# Patient Record
Sex: Female | Born: 1970 | Race: Black or African American | Hispanic: No | Marital: Single | Smoking: Never smoker
Health system: Southern US, Community
[De-identification: ages and names within clinical notes are randomized; demographics above are authoritative.]

## PROBLEM LIST (undated history)

## (undated) DIAGNOSIS — R030 Elevated blood-pressure reading, without diagnosis of hypertension: Secondary | ICD-10-CM

## (undated) HISTORY — DX: Elevated blood-pressure reading, without diagnosis of hypertension: R03.0

## (undated) HISTORY — PX: MOUTH SURGERY: SHX715

## (undated) HISTORY — PX: TUBAL LIGATION: SHX77

---

## 2005-09-28 ENCOUNTER — Ambulatory Visit: Payer: Self-pay | Admitting: Gynecology

## 2005-09-28 ENCOUNTER — Inpatient Hospital Stay (HOSPITAL_COMMUNITY): Admission: AD | Admit: 2005-09-28 | Discharge: 2005-09-28 | Payer: Self-pay | Admitting: Gynecology

## 2005-10-01 ENCOUNTER — Inpatient Hospital Stay (HOSPITAL_COMMUNITY): Admission: AD | Admit: 2005-10-01 | Discharge: 2005-10-03 | Payer: Self-pay | Admitting: Obstetrics and Gynecology

## 2005-10-01 ENCOUNTER — Ambulatory Visit: Payer: Self-pay | Admitting: Obstetrics and Gynecology

## 2006-10-14 ENCOUNTER — Emergency Department (HOSPITAL_COMMUNITY): Admission: EM | Admit: 2006-10-14 | Discharge: 2006-10-14 | Payer: Self-pay | Admitting: Family Medicine

## 2007-08-17 ENCOUNTER — Ambulatory Visit: Payer: Self-pay | Admitting: Gynecology

## 2007-10-26 ENCOUNTER — Emergency Department (HOSPITAL_COMMUNITY): Admission: EM | Admit: 2007-10-26 | Discharge: 2007-10-26 | Payer: Self-pay | Admitting: Emergency Medicine

## 2007-11-05 ENCOUNTER — Emergency Department (HOSPITAL_COMMUNITY): Admission: EM | Admit: 2007-11-05 | Discharge: 2007-11-05 | Payer: Self-pay | Admitting: Emergency Medicine

## 2007-11-07 ENCOUNTER — Ambulatory Visit: Payer: Self-pay | Admitting: Gynecology

## 2007-11-27 ENCOUNTER — Ambulatory Visit: Payer: Self-pay | Admitting: Internal Medicine

## 2007-11-27 ENCOUNTER — Encounter (INDEPENDENT_AMBULATORY_CARE_PROVIDER_SITE_OTHER): Payer: Self-pay | Admitting: Family Medicine

## 2007-11-27 LAB — CONVERTED CEMR LAB
ALT: 23 units/L (ref 0–35)
BUN: 12 mg/dL (ref 6–23)
CO2: 23 meq/L (ref 19–32)
CRP: 3.5 mg/dL — ABNORMAL HIGH (ref <0.6)
Cholesterol: 136 mg/dL (ref 0–200)
Creatinine, Ser: 0.76 mg/dL (ref 0.40–1.20)
Eosinophils Absolute: 0.1 10*3/uL (ref 0.0–0.7)
Eosinophils Relative: 1 % (ref 0–5)
HCT: 39.9 % (ref 36.0–46.0)
HDL: 37 mg/dL — ABNORMAL LOW (ref >39)
Lymphs Abs: 2.1 10*3/uL (ref 0.7–4.0)
MCV: 93.2 fL (ref 78.0–100.0)
Platelets: 548 10*3/uL — ABNORMAL HIGH (ref 150–400)
RDW: 12.8 % (ref 11.5–15.5)
Sed Rate: 81 mm/hr — ABNORMAL HIGH (ref 0–22)
Total Bilirubin: 0.6 mg/dL (ref 0.3–1.2)
Total CHOL/HDL Ratio: 3.7
Triglycerides: 71 mg/dL (ref <150)
VLDL: 14 mg/dL (ref 0–40)
WBC: 5.3 10*3/uL (ref 4.0–10.5)

## 2007-11-29 ENCOUNTER — Ambulatory Visit (HOSPITAL_COMMUNITY): Admission: RE | Admit: 2007-11-29 | Discharge: 2007-11-29 | Payer: Self-pay | Admitting: Gynecology

## 2007-11-29 ENCOUNTER — Ambulatory Visit: Payer: Self-pay | Admitting: Gynecology

## 2007-12-18 ENCOUNTER — Ambulatory Visit: Payer: Self-pay | Admitting: Internal Medicine

## 2009-03-31 ENCOUNTER — Ambulatory Visit: Payer: Self-pay | Admitting: Family Medicine

## 2009-08-14 ENCOUNTER — Ambulatory Visit: Payer: Self-pay | Admitting: Internal Medicine

## 2009-08-20 ENCOUNTER — Ambulatory Visit: Payer: Self-pay | Admitting: Family Medicine

## 2009-09-14 ENCOUNTER — Encounter: Admission: RE | Admit: 2009-09-14 | Discharge: 2009-09-14 | Payer: Self-pay | Admitting: Internal Medicine

## 2010-04-09 ENCOUNTER — Encounter: Admission: RE | Admit: 2010-04-09 | Discharge: 2010-04-09 | Payer: Self-pay | Admitting: Internal Medicine

## 2010-07-02 ENCOUNTER — Ambulatory Visit: Payer: Self-pay | Admitting: Obstetrics & Gynecology

## 2010-08-06 ENCOUNTER — Ambulatory Visit
Admission: RE | Admit: 2010-08-06 | Discharge: 2010-08-06 | Payer: Self-pay | Source: Home / Self Care | Attending: Obstetrics and Gynecology | Admitting: Obstetrics and Gynecology

## 2010-08-15 ENCOUNTER — Encounter: Payer: Self-pay | Admitting: Internal Medicine

## 2010-08-27 ENCOUNTER — Other Ambulatory Visit: Payer: Self-pay | Admitting: Family Medicine

## 2010-08-27 DIAGNOSIS — Z09 Encounter for follow-up examination after completed treatment for conditions other than malignant neoplasm: Secondary | ICD-10-CM

## 2010-09-23 ENCOUNTER — Ambulatory Visit
Admission: RE | Admit: 2010-09-23 | Discharge: 2010-09-23 | Disposition: A | Discharge status: Home / Self Care | Payer: Self-pay | Source: Ambulatory Visit | Attending: Family Medicine | Admitting: Family Medicine

## 2010-09-23 DIAGNOSIS — Z09 Encounter for follow-up examination after completed treatment for conditions other than malignant neoplasm: Secondary | ICD-10-CM

## 2010-09-29 ENCOUNTER — Ambulatory Visit
Admission: RE | Admit: 2010-09-29 | Discharge: 2010-09-29 | Disposition: A | Discharge status: Home / Self Care | Payer: Self-pay | Source: Ambulatory Visit | Attending: Family Medicine | Admitting: Family Medicine

## 2010-09-29 ENCOUNTER — Other Ambulatory Visit: Payer: Self-pay | Admitting: Family Medicine

## 2010-09-29 DIAGNOSIS — Z09 Encounter for follow-up examination after completed treatment for conditions other than malignant neoplasm: Secondary | ICD-10-CM

## 2010-12-07 NOTE — H&P (Signed)
Mckenzie Perez, Mckenzie Perez                ACCOUNT NO.:  [id number]   MEDICAL RECORD NO.:  [id number]          PATIENT TYPE:  AMB   LOCATION:                                FACILITY:  [facility]   PHYSICIAN:  Ginger Carne, MD  DATE OF BIRTH:  [birth year]   DATE OF ADMISSION:  11/29/2007  DATE OF DISCHARGE:                              HISTORY & PHYSICAL   REASON FOR HOSPITALIZATION:  Sterilization.   HISTORY OF PRESENT ILLNESS:  This patient is a 40 year old gravida 5,  para 3-0-2-3 female who is desirous of sterilization.  She presently  uses NuvaRing and has hypertension.  The patient has no desire for  further childbearing and has no psychological or medical  contraindications that would prohibit her from having a sterilization.   OB/GYN HISTORY:  The patient has had 3 full-term vaginal deliveries and  2 first trimester miscarriages.   ALLERGIES:  None.   CURRENT MEDICATIONS:  NuvaRing.   PAST MEDICAL HISTORY:  None.   SURGICAL HISTORY:  None.   FAMILY HISTORY:  Mother has hypertension.  Father has bone marrow  carcinoma.   SOCIAL HISTORY:  The patient is a nonsmoker.  Denies alcohol or illicit  drug abuse.  She is a stay-at-home mother.   REVIEW OF SYSTEMS:  A 14-point comprehensive review of systems within  normal limits.   PHYSICAL EXAMINATION:  VITAL SIGNS:  Blood pressure is 129/93, weight  154 pounds, height 5 feet 7 inches, pulse 97 and regular.  HEENT:  Grossly normal.  BREAST:  Without masses, discharge, thickenings, or tenderness.  CHEST:  Clear to percussion and auscultation.  CARDIOVASCULAR:  Without murmurs or enlargements.  Regular rate and  rhythm.  EXTREMITIES:  All within normal limits.  LYMPHATIC:  All within normal limits.  SKIN:  All within normal limits.  NEUROLOGICAL:  All within normal limits.  MUSCULOSKELETAL:  All within normal limits.  ABDOMEN:  Soft without gross hepatosplenomegaly.  PELVIC:  External genitalia, vulva, and vagina normal.  Cervix  smooth  without erosions or lesions.  Normal Pap smear from September 2008.  Uterus small, anteverted and flexed.  Both adnexa are palpable and found  to be normal.   IMPRESSION:  Request for sterilization.   PLAN:  The patient will undergo a bilateral laparoscopic tubal  cauterization.  Ashby Dawes of said procedure was discussed in detail.  Failure rate of approximately 1 in 1000 was discussed.  The patient was  also offered the option of a Mirena IUD, which she declined.      Ginger Carne, MD  Electronically Signed     SHB/MEDQ  D:  11/22/2007  T:  11/23/2007  Job:  [id number]

## 2010-12-07 NOTE — H&P (Signed)
Mckenzie Perez, Mckenzie Perez                ACCOUNT NO.:  [id number]   MEDICAL RECORD NO.:  [id number]          PATIENT TYPE:  AMB   LOCATION:                                [facility]ILITY:  [facility]   PHYSICIAN:  Ginger Carne, MD  DATE OF BIRTH:  September 07, 1970   DATE OF ADMISSION:  10/26/2007  DATE OF DISCHARGE:                              HISTORY & PHYSICAL   DATE OF SURGERY:  October 26, 2007.   REASON FOR HOSPITALIZATION:  Sterilization.   HISTORY OF PRESENT ILLNESS:  This patient is a 40 year old gravida 5,  para 3-0-2-3 female who requests permanent sterilization.  She has been  using NuvaRing birth control until now.  The patient has no desire for  further childbearing and has no psychological or medical  contraindications to said procedure.   OB/GYN HISTORY:  The patient has had three living children by vaginal  route and two first trimester miscarriages.   ALLERGIES:  None.   CURRENT MEDICATIONS:  NuvaRing.   PAST MEDICAL HISTORY:  None.   PAST SURGICAL HISTORY:  None.   REVIEW OF SYSTEMS:  A 14-point comprehensive review of systems negative.   SOCIAL HISTORY:  The patient is a nonsmoker.  Denies alcohol or illicit  drug abuse.   FAMILY HISTORY:  Mother has hypertension and father has bone marrow  carcinoma.   PHYSICAL EXAMINATION:  VITAL SIGNS:  Blood pressure is 129/93, weight  154 pounds, height 5 feet 7 inches.  HEENT: Grossly normal.  BREASTS:  Exam without masses, discharge, thickenings or tenderness  bilaterally.  CHEST:  Clear to percussion and auscultation.  CARDIOVASCULAR:  Exam without murmurs or enlargements.  Regular rate and  rhythm.  EXTREMITY/LYMPHATIC/SKIN/NEUROLOGICAL/MUSCULOSKELETAL:  Systems within  normal limits.  ABDOMEN:  Soft without gross hepatosplenomegaly.  PELVIC: External Genitalia: Vulva and vagina normal.  Cervix smooth  without erosions or lesions.  [facility]  Uterus small, anteverted and flexed.  Both  adnexa palpable  and found to be normal.   IMPRESSION:  Request for permanent sterilization.   PLAN:  The patient will undergo a laparoscopic bilateral tubal  cauterization.  Ashby Dawes of said procedure discussed in detail.  Failure  rate of 07/998 discussed with the patient including the risk of an  ectopic pregnancy.  Ashby Dawes of said procedure discussed in detail with  the patient and understood by same.  The patient therefore is scheduled  for October 26, 2007.      Ginger Carne, MD  Electronically Signed     SHB/MEDQ  D:  10/23/2007  T:  10/23/2007  Job:  [id number]

## 2010-12-07 NOTE — Op Note (Signed)
[name], Mckenzie Perez                ACCOUNT NO.:  [id number]   MEDICAL RECORD NO.:  [id number]          PATIENT TYPE:  AMB   LOCATION:  [facility]                           [facility]ILITY:  [facility]   PHYSICIAN:  Ginger Carne, MD  DATE OF BIRTH:  [birth year]   DATE OF PROCEDURE:  11/29/2007  DATE OF DISCHARGE:                               OPERATIVE REPORT   PREOPERATIVE DIAGNOSIS:  Sterilization.   POSTOPERATIVE DIAGNOSIS:  Sterilization.   PROCEDURE:  Bilateral laparoscopic tubal cauterization.   SURGEON:  Ginger Carne, MD   ASSISTANT:  None.   COMPLICATIONS:  None immediate.   ESTIMATED BLOOD LOSS:  Minimal.   SPECIMEN:  None.   OPERATIVE FINDINGS:  External genitalia, vulva, and vagina normal.  Cervix smooth without erosions or lesions.  The laparoscopic evaluation  revealed no evidence of intrapelvic pathology.  Both tubes were  identified from their isthmus to fimbriated ends, separated apart from  their respective round ligaments.  The uterus appeared normal in  appearance.  Both tubes and ovaries also appeared normal.   OPERATIVE PROCEDURE:  The patient was prepped and draped in usual  fashion and placed in lithotomy position.  Betadine solution was used  for antiseptic and the patient was catheterized prior to procedure.  After adequate general anesthesia, Hickman tenaculum was placed in the  endocervical canal.  A vertical infraumbilical incision was made and a  Veress needle was placed in the abdomen.  Opening and closing pressures  were 10-15 mmHg.  Needle released.  Trocar placed in the same incision.  Laparoscope placed in the trocar sleeve.  Afterwards, a 5-mm port was  made under direct visualization in the left lower quadrant.  Both tubes  were identified and bipolar cauterized for approximately 2-3 cm from  their isthmus-to-ampullary regions.  The tubes were cut afterwards.  No  active bleeding noted.  Gas released.  Trocars removed.  Closure of the  10-mm fascia  site with 0-Vicryl suture and 4-0 Vicryl for the  subcuticular closure.  Instruments and sponge counts were correct.  The  patient tolerated the procedure well, returned to the post anesthesia  recovery room in excellent condition.      Ginger Carne, MD  Electronically Signed    SHB/MEDQ  D:  11/29/2007  T:  11/30/2007  Job:  [id number]

## 2011-06-13 ENCOUNTER — Encounter: Payer: Self-pay | Admitting: Medical

## 2011-06-13 ENCOUNTER — Ambulatory Visit (INDEPENDENT_AMBULATORY_CARE_PROVIDER_SITE_OTHER): Payer: Managed Care, Other (non HMO) | Admitting: Medical

## 2011-06-13 DIAGNOSIS — Z0000 Encounter for general adult medical examination without abnormal findings: Secondary | ICD-10-CM

## 2011-06-13 DIAGNOSIS — R002 Palpitations: Secondary | ICD-10-CM

## 2011-06-13 LAB — CBC WITH DIFFERENTIAL/PLATELET
Basophils Absolute: 0 10*3/uL (ref 0.0–0.1)
Lymphocytes Relative: 50 % — ABNORMAL HIGH (ref 12–46)
Lymphs Abs: 2.5 10*3/uL (ref 0.7–4.0)
Neutrophils Relative %: 39 % — ABNORMAL LOW (ref 43–77)
Platelets: 405 10*3/uL — ABNORMAL HIGH (ref 150–400)
RBC: 4.35 MIL/uL (ref 3.87–5.11)
RDW: 12.7 % (ref 11.5–15.5)
WBC: 4.9 10*3/uL (ref 4.0–10.5)

## 2011-06-13 LAB — COMPREHENSIVE METABOLIC PANEL
ALT: 11 U/L (ref 0–35)
Albumin: 4.3 g/dL (ref 3.5–5.2)
CO2: 29 mEq/L (ref 19–32)
Calcium: 10 mg/dL (ref 8.4–10.5)
Chloride: 102 mEq/L (ref 96–112)
Glucose, Bld: 83 mg/dL (ref 70–99)
Potassium: 4.3 mEq/L (ref 3.5–5.3)
Sodium: 138 mEq/L (ref 135–145)
Total Protein: 7.5 g/dL (ref 6.0–8.3)

## 2011-06-13 NOTE — Progress Notes (Deleted)
Subjective:   HPI  Mckenzie Perez is a 40 y.o. female who pr     No other aggravating or relieving factors.    No other c/o.   The following portions of the patient's history were reviewed and updated as appropriate: {history reviewed:20406::"allergies","current medications","past family history","past medical history","past social history","past surgical history","problem list"}.    Review of Systems Constitutional: -fever, -chills, -sweats, -unexpected -weight change,-fatigue ENT: -runny nose, -ear pain, -sore throat Cardiology:  -chest pain, -palpitations, -edema Respiratory: -cough, -shortness of breath, -wheezing Gastroenterology: -abdominal pain, -nausea, -vomiting, -diarrhea, -constipation Hematology: -bleeding or bruising problems Musculoskeletal: -arthralgias, -myalgias, -joint swelling, -back pain Ophthalmology: -vision changes Urology: -dysuria, -difficulty urinating, -hematuria, -urinary frequency, -urgency Neurology: -headache, -weakness, -tingling, -numbness

## 2011-06-13 NOTE — Patient Instructions (Signed)

## 2011-06-13 NOTE — Progress Notes (Signed)
Subjective:   HPI  Mckenzie Perez is a 40 y.o. female who presents for a complete physical.  She is a new patient accompanied by her husband/common law partner of many years.  He is concerned for her as she had some abnormalities on labs through work [employer] recently.  He notes that they both eat a vegetarian diet.  They both wonder if she is deficient in vitamins or other nutrients.  She recently had some teeth pulled as she doesn't brush daily.  She also notes that although she doesn't eat meat in general, she does eat some fish and eats cookies/cakes, sweets.    She notes elevated BP in the past, but not on medication for hypertension.  She does note some palpitations of recent.  She has had some stress at work, but says these palpitations were going on prior to her recent work related stress.    Her mood is down at times, worries about finances, wishes they had more money to do tings for the kids.  But both her and husband say they have what they need, but not necessarily all the material wants they would like for their kids.  Reviewed their medical, surgical, family, social, medication, and allergy history and updated chart as appropriate.   Past Medical History  Diagnosis Date  . Elevated blood pressure reading without diagnosis of hypertension     Past Surgical History  Procedure Date  . Tubal ligation   . Mouth surgery     Family History  Problem Relation Age of Onset  . Hypertension Mother   . Cancer Father     lung cancer, bone and brain mets  . Cancer Brother 40    died of stomach cancer  . Stroke Neg Hx   . Heart disease Neg Hx   . Hyperlipidemia Neg Hx   . Diabetes Neg Hx     History   Social History  . Marital Status: Single    Spouse Name: N/A    Number of Children: N/A  . Years of Education: N/A   Occupational History  . data entry [employer]   Social History Main Topics  . Smoking status: Never Smoker   . Smokeless tobacco: Not on file   . Alcohol Use: No  . Drug Use: No  . Sexually Active: Not on file   Other Topics Concern  . Not on file   Social History Narrative   Common law marriage, 3 children, exercise - none    No current outpatient prescriptions on file prior to visit.    No Known Allergies  Review of Systems Constitutional: -fever, -chills, -sweats, +unexpected weight change, -anorexia, +fatigue Allergy: -sneezing, -itching, -congestion Dermatology: denies changing moles, rash, lumps, new worrisome lesions ENT: -runny nose, -ear pain, -sore throat, -hoarseness, -sinus pain, -teeth pain, -tinnitus, -hearing loss, -epistaxis Cardiology:  +chest pain, +palpitations, -edema, -orthopnea, -paroxysmal nocturnal dyspnea Respiratory: -cough, -shortness of breath, -dyspnea on exertion, -wheezing, -hemoptysis Gastroenterology: -abdominal pain, -nausea, -vomiting, -diarrhea, -constipation, -blood in stool, -changes in bowel movement, -dysphagia Hematology: -bleeding or bruising problems Musculoskeletal: -arthralgias, -myalgias, -joint swelling, +back pain, -neck pain, -cramping, -gait changes Ophthalmology: -vision changes, -eye redness, -itching, -discharge Urology: -dysuria, -difficulty urinating, -hematuria, -urinary frequency, -urgency, incontinence Neurology: -headache, -weakness, -tingling, +numbness, -speech abnormality, -memory loss, -falls, +dizziness Psychology:  +depressed mood, -agitation, -sleep problems     Objective:   Physical Exam  Filed Vitals:   06/13/11 0905  BP: 130/80  Pulse: 88  Temp: 98.5 F (  36.9 C)  Resp: 16    General appearance: alert, no distress, WD/WN, black female Skin: few right dorsal hand with few small brown flat lesions that she notes being from recent grease burns while cooking HEENT: normocephalic, conjunctiva/corneas normal, sclerae anicteric, PERRLA, EOMi, nares patent, no discharge or erythema, pharynx normal Oral cavity: MMM, tongue normal,some teeth missing, but  rest of teeth in good repair Neck: supple, no lymphadenopathy, no thyromegaly, no masses, normal ROM, no bruits Chest: non tender, normal shape and expansion Heart: RRR, normal S1, S2, no murmurs Lungs: CTA bilaterally, no wheezes, rhonchi, or rales Abdomen: +bs, soft, non tender, non distended, no masses, no hepatomegaly, no splenomegaly, no bruits Back: non tender, normal ROM, no scoliosis Musculoskeletal: upper extremities non tender, no obvious deformity, normal ROM throughout, lower extremities non tender, no obvious deformity, normal ROM throughout Extremities: no edema, no cyanosis, no clubbing Pulses: 2+ symmetric, upper and lower extremities, normal cap refill Neurological: alert, oriented x 3, CN2-12 intact, strength normal upper extremities and lower extremities, sensation normal throughout, DTRs 2+ throughout, no cerebellar signs, gait normal Psychiatric: normal affect, behavior normal, pleasant  Breast, gyn, rectal - deferred to gynecology   Adult ECG Report  Indication: palpitations  Rate: 71 bpm  Rhythm: normal sinus rhythm  QRS Axis: 63 degrees  PR Interval: 194 ms  QRS Duration: 76 ms  QTc: 431 ms  Conduction Disturbances: none  Other Abnormalities: none  Patient's cardiac risk factors are: sedentary lifestyle.  EKG comparison: none  Narrative Interpretation: unremarkable   Assessment and Plan :    Encounter Diagnoses  Name Primary?  . General medical examination Yes  . Palpitation     Physical exam - discussed healthy lifestyle, diet, exercise, preventative care, vaccinations, and addressed their concerns.  Handout given.  Recent lipid and HgbA1C labs from employer were normal despite whatever advise she was given.  EKG today unremarkable.  Advised if palpations worsen, continue, or cause additional symptoms such as worse frequency of palpations, SOB, syncope to call and return.  Additional labs today.  Follow-up in pending labs.

## 2011-06-14 LAB — VITAMIN D 25 HYDROXY (VIT D DEFICIENCY, FRACTURES): Vit D, 25-Hydroxy: 37 ng/mL (ref 30–89)

## 2012-04-01 ENCOUNTER — Encounter (HOSPITAL_COMMUNITY): Payer: Self-pay | Admitting: Emergency Medicine

## 2012-04-01 ENCOUNTER — Emergency Department (HOSPITAL_COMMUNITY): Payer: BC Managed Care – PPO

## 2012-04-01 ENCOUNTER — Emergency Department (HOSPITAL_COMMUNITY)
Admission: EM | Admit: 2012-04-01 | Discharge: 2012-04-01 | Disposition: A | Discharge status: Home / Self Care | Payer: BC Managed Care – PPO | Attending: Emergency Medicine | Admitting: Emergency Medicine

## 2012-04-01 DIAGNOSIS — X58XXXA Exposure to other specified factors, initial encounter: Secondary | ICD-10-CM | POA: Insufficient documentation

## 2012-04-01 DIAGNOSIS — S62619A Displaced fracture of proximal phalanx of unspecified finger, initial encounter for closed fracture: Secondary | ICD-10-CM

## 2012-04-01 DIAGNOSIS — IMO0002 Reserved for concepts with insufficient information to code with codable children: Secondary | ICD-10-CM | POA: Insufficient documentation

## 2012-04-01 MED ORDER — NAPROXEN SODIUM 550 MG PO TABS: 550.0000 mg | ORAL_TABLET | Freq: Two times a day (BID) | ORAL | Status: AC

## 2012-04-01 NOTE — Progress Notes (Signed)
Orthopedic Tech Progress Note Patient Details:  Mckenzie Perez Feb 11, 1971 [id number]  Ortho Devices Type of Ortho Device: Finger splint Ortho Device/Splint Location: (R) UE Ortho Device/Splint Interventions: Application   [name] 04/01/2012, 3:02 PM

## 2012-04-01 NOTE — ED Notes (Signed)
Pt. Stated, I woke up yesterday morning and my rt. Hand was swollen and painful and I can't even open it up

## 2012-04-01 NOTE — ED Provider Notes (Signed)
History  Scribed for Geoffery Lyons, MD, the patient was seen in room TR07C/TR07C. This chart was scribed by Candelaria Stagers. The patient's care started at 1:13 PM   CSN: [id number]  Arrival date & time 04/01/12  1126   First MD Initiated Contact with Patient 04/01/12 1310      Chief Complaint  Patient presents with  . Hand Pain    The history is provided by the patient. No language interpreter was used.   Mckenzie Perez is a 41 y.o. female who presents to the Emergency Department complaining of right hand pain that started yesterday morning.  Pt states he is also experiencing swelling of the right hand and numbness of the finger.  She is unable to extend the fingers or make a fist.  The pain is focused around the middle finger MCP joint.  She denies any injury or new activity.  Nothing seems to make the sx better or worse.  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         Past Medical History  Diagnosis Date  . Elevated blood pressure reading without diagnosis of hypertension     Past Surgical History  Procedure Date  . Tubal ligation   . Mouth surgery     Family History  Problem Relation Age of Onset  . Hypertension Mother   . Cancer Father     lung cancer, bone and brain mets  . Cancer Brother 40    died of stomach cancer  . Stroke Neg Hx   . Heart disease Neg Hx   . Hyperlipidemia Neg Hx   . Diabetes Neg Hx     History  Substance Use Topics  . Smoking status: Never Smoker   . Smokeless tobacco: Not on file  . Alcohol Use: No    OB History    Grav Para Term Preterm Abortions TAB SAB Ect Mult Living                  Review of Systems  Musculoskeletal: Positive for arthralgias (right hand pain ).  Neurological: Positive for numbness (numbness of the right fingers).  All other systems reviewed and are negative.    Allergies  Review of patient's allergies indicates no known allergies.  Home Medications  No current outpatient prescriptions on file.  BP  150/92  Pulse 79  Temp 98.9 F (37.2 C) (Oral)  Resp 17  SpO2 98%  LMP 03/18/2012  Physical Exam  Nursing note and vitals reviewed. Constitutional: She is oriented to person, place, and time. She appears well-developed and well-nourished. No distress.  HENT:  Head: Normocephalic and atraumatic.  Eyes: EOM are normal. Pupils are equal, round, and reactive to light.  Neck: Neck supple. No tracheal deviation present.  Cardiovascular: Normal rate.   Pulmonary/Chest: Effort normal. No respiratory distress.  Abdominal: Soft. She exhibits no distension.  Musculoskeletal:       Swelling and tenderness to palpation over right third MCP joint.  No warmth or erythema.  No abrasion/lacerations.   Neurological: She is alert and oriented to person, place, and time.  Skin: Skin is warm and dry.  Psychiatric: She has a normal mood and affect. Her behavior is normal.    ED Course  Procedures  DIAGNOSTIC STUDIES: Oxygen Saturation is 98% on room air, normal by my interpretation.    COORDINATION OF CARE:  13:19 Ordered: DG Hand Complete Right   Labs Reviewed - No data to display Dg Hand Complete Right  04/01/2012  *RADIOLOGY REPORT*  Clinical Data: Pain and swelling in the right hand.  RIGHT HAND - COMPLETE 3+ VIEW  Comparison: No priors.  Findings: Three views of the right hand demonstrate a tiny ossific density adjacent to the medial aspect of the base of the proximal third phalanx.  Adjacent to this density, there is a lucency in the periarticular aspect of the third proximal phalangeal base medially.  These findings may reflect a small capsular avulsion fracture at the third MCP joint in the relatively well-defined margins, this is favored to represent sequela of an old injury.  No other definite acute fracture, subluxation or dislocation is appreciated.  IMPRESSION: 1. Findings, as above, suggestive of prior UCL avulsion fracture at the base of the third proximal phalanx. Given the lack of a  history of recent trauma, and the appearance of this area, this is less likely to represent an acute injury. 2.  No other acute findings in the hand to account for the patient's symptoms.   Original Report Authenticated By: Florencia Reasons, M.D.      No diagnosis found.    MDM  The xrays show what appears to be an avulsion fracture of the proximal 3rd phalanx with no history of trauma.  Will treat with buddy tape, anaprox.  To follow up prn.  I personally performed the services described in this documentation, which was scribed in my presence. The recorded information has been reviewed and considered.       Geoffery Lyons, MD 04/01/12 [phone]

## 2013-09-27 ENCOUNTER — Ambulatory Visit: Payer: Managed Care, Other (non HMO) | Admitting: Medical

## 2015-03-04 ENCOUNTER — Ambulatory Visit (INDEPENDENT_AMBULATORY_CARE_PROVIDER_SITE_OTHER): Payer: Medicaid Other | Admitting: Obstetrics & Gynecology

## 2015-03-04 ENCOUNTER — Encounter: Payer: Self-pay | Admitting: Obstetrics & Gynecology

## 2015-03-04 VITALS — BP 122/80 | HR 86 | Ht 67.0 in | Wt 134.2 lb

## 2015-03-04 DIAGNOSIS — N898 Other specified noninflammatory disorders of vagina: Secondary | ICD-10-CM

## 2015-03-04 DIAGNOSIS — N9489 Other specified conditions associated with female genital organs and menstrual cycle: Secondary | ICD-10-CM

## 2015-03-04 NOTE — Progress Notes (Signed)
Patient ID: Mckenzie Perez, female   DOB: [birth year], 44 y.o.   MRN: [id number] Pt present today for vaginal discharge for a couple of weeks.

## 2015-03-04 NOTE — Progress Notes (Signed)
   CLINIC ENCOUNTER NOTE  History:  44 y.o. G1P1 here today for evaluation of foul-smelling vaginal discharge.  No other symptoms. She denies any abnormal vaginal bleeding, pelvic pain or other concerns.   Past Medical History  Diagnosis Date  . Elevated blood pressure reading without diagnosis of hypertension     Past Surgical History  Procedure Laterality Date  . Tubal ligation    . Mouth surgery      The following portions of the patient's history were reviewed and updated as appropriate: allergies, current medications, past family history, past medical history, past social history, past surgical history and problem list.   Health Maintenance:  Normal pap over three years ago.    Review of Systems:  Pertinent items are noted in HPI. Comprehensive review of systems was otherwise negative.  Objective:  Physical Exam BP 122/80 mmHg  Pulse 86  Ht  (1.702 m)  Wt 134 lb 3.2 oz (60.873 kg)  BMI 21.01 kg/m2  SpO2 100% CONSTITUTIONAL: Well-developed, well-nourished female in no acute distress.  HENT:  Normocephalic, atraumatic. External right and left ear normal. Oropharynx is clear and moist EYES: Conjunctivae and EOM are normal. Pupils are equal, round, and reactive to light. No scleral icterus.  NECK: Normal range of motion, supple, no masses SKIN: Skin is warm and dry. No rash noted. Not diaphoretic. No erythema. No pallor. NEUROLGIC: Alert and oriented to person, place, and time. Normal reflexes, muscle tone coordination. No cranial nerve deficit noted. PSYCHIATRIC: Normal mood and affect. Normal behavior. Normal judgment and thought content. CARDIOVASCULAR: Normal heart rate noted RESPIRATORY: Effort and breath sounds normal, no problems with respiration noted ABDOMEN: Soft, no distention noted.   PELVIC: Normal appearing external genitalia; normal appearing vaginal mucosa and cervix. Scant vaginal discharge noted, wet prep obtained.   MUSCULOSKELETAL: Normal range of  motion. No edema noted.   Assessment & Plan:  Will follow up wet prep and manage accordingly Proper vulvar hygiene emphasized: discussed avoidance of perfumed soaps, detergents, lotions and any type of douches; in addition to wearing cotton underwear and no underwear at night.  Also recommended cleaning front to back, voiding and cleaning up after intercourse.  Routine preventative health maintenance measures emphasized. Please refer to After Visit Summary for other counseling recommendations.    Jaynie Collins, MD, FACOG Attending Obstetrician & Gynecologist Center for [employer], [facility]

## 2015-03-04 NOTE — Patient Instructions (Signed)
Return to clinic for any scheduled appointments or for any gynecologic concerns as needed.   

## 2015-03-05 ENCOUNTER — Telehealth: Payer: Self-pay | Admitting: *Deleted

## 2015-03-05 DIAGNOSIS — N760 Acute vaginitis: Principal | ICD-10-CM

## 2015-03-05 DIAGNOSIS — B9689 Other specified bacterial agents as the cause of diseases classified elsewhere: Secondary | ICD-10-CM

## 2015-03-05 LAB — WET PREP, GENITAL
TRICH WET PREP: NONE SEEN
YEAST WET PREP: NONE SEEN

## 2015-03-05 MED ORDER — TINIDAZOLE 250 MG PO TABS: 2.0000 g | ORAL_TABLET | Freq: Every day | ORAL | Status: DC

## 2015-03-05 NOTE — Telephone Encounter (Signed)
Pt called for results of wetprep. Advised her of mod clue cells. tindamax sent to pharmacy per protocol.

## 2015-03-06 ENCOUNTER — Other Ambulatory Visit: Payer: Self-pay

## 2015-03-06 ENCOUNTER — Telehealth: Payer: Self-pay

## 2015-03-06 MED ORDER — METRONIDAZOLE 500 MG PO TABS: 500.0000 mg | ORAL_TABLET | Freq: Two times a day (BID) | ORAL | Status: DC

## 2015-03-06 NOTE — Telephone Encounter (Signed)
Flagyl has been called in to [facility] pharmacy patient is aware to pick up today.

## 2015-03-06 NOTE — Telephone Encounter (Signed)
Pt would like to change Tindamax rx to Flagyl insurance will not cover. She would like this sent to [facility].

## 2015-03-06 NOTE — Telephone Encounter (Signed)
Pt was seen

## 2015-10-17 ENCOUNTER — Emergency Department (HOSPITAL_COMMUNITY)
Admission: EM | Admit: 2015-10-17 | Discharge: 2015-10-17 | Disposition: A | Discharge status: Home / Self Care | Payer: Medicaid Other | Attending: Emergency Medicine | Admitting: Emergency Medicine

## 2015-10-17 ENCOUNTER — Encounter (HOSPITAL_COMMUNITY): Payer: Self-pay

## 2015-10-17 ENCOUNTER — Emergency Department (HOSPITAL_COMMUNITY): Payer: Medicaid Other

## 2015-10-17 DIAGNOSIS — R109 Unspecified abdominal pain: Secondary | ICD-10-CM | POA: Insufficient documentation

## 2015-10-17 DIAGNOSIS — Z3202 Encounter for pregnancy test, result negative: Secondary | ICD-10-CM | POA: Insufficient documentation

## 2015-10-17 DIAGNOSIS — M791 Myalgia: Secondary | ICD-10-CM | POA: Diagnosis not present

## 2015-10-17 DIAGNOSIS — J111 Influenza due to unidentified influenza virus with other respiratory manifestations: Secondary | ICD-10-CM | POA: Insufficient documentation

## 2015-10-17 DIAGNOSIS — R05 Cough: Secondary | ICD-10-CM | POA: Diagnosis present

## 2015-10-17 LAB — COMPREHENSIVE METABOLIC PANEL
ALBUMIN: 3.3 g/dL — AB (ref 3.5–5.0)
ALT: 33 U/L (ref 14–54)
AST: 31 U/L (ref 15–41)
Alkaline Phosphatase: 57 U/L (ref 38–126)
Anion gap: 12 (ref 5–15)
BILIRUBIN TOTAL: 1.4 mg/dL — AB (ref 0.3–1.2)
BUN: 9 mg/dL (ref 6–20)
CHLORIDE: 99 mmol/L — AB (ref 101–111)
CO2: 21 mmol/L — AB (ref 22–32)
Calcium: 8.6 mg/dL — ABNORMAL LOW (ref 8.9–10.3)
Creatinine, Ser: 0.88 mg/dL (ref 0.44–1.00)
GFR calc Af Amer: >60 mL/min (ref >60)
GFR calc non Af Amer: >60 mL/min (ref >60)
GLUCOSE: 130 mg/dL — AB (ref 65–99)
POTASSIUM: 3.8 mmol/L (ref 3.5–5.1)
Sodium: 132 mmol/L — ABNORMAL LOW (ref 135–145)
Total Protein: 7.1 g/dL (ref 6.5–8.1)

## 2015-10-17 LAB — CBC WITH DIFFERENTIAL/PLATELET
BASOS PCT: 0 %
Basophils Absolute: 0 10*3/uL (ref 0.0–0.1)
EOS PCT: 0 %
Eosinophils Absolute: 0 10*3/uL (ref 0.0–0.7)
HEMATOCRIT: 39.2 % (ref 36.0–46.0)
HEMOGLOBIN: 13.3 g/dL (ref 12.0–15.0)
LYMPHS PCT: 17 %
Lymphs Abs: 1.5 10*3/uL (ref 0.7–4.0)
MCH: 30.9 pg (ref 26.0–34.0)
MCHC: 33.9 g/dL (ref 30.0–36.0)
MCV: 91.2 fL (ref 78.0–100.0)
MONOS PCT: 8 %
Monocytes Absolute: 0.7 10*3/uL (ref 0.1–1.0)
NEUTROS ABS: 6.4 10*3/uL (ref 1.7–7.7)
Neutrophils Relative %: 75 %
Platelets: 233 10*3/uL (ref 150–400)
RBC: 4.3 MIL/uL (ref 3.87–5.11)
RDW: 12.4 % (ref 11.5–15.5)
WBC Morphology: INCREASED
WBC: 8.6 10*3/uL (ref 4.0–10.5)

## 2015-10-17 LAB — URINE MICROSCOPIC-ADD ON

## 2015-10-17 LAB — URINALYSIS, ROUTINE W REFLEX MICROSCOPIC
Glucose, UA: NEGATIVE mg/dL
HGB URINE DIPSTICK: NEGATIVE
Ketones, ur: >80 mg/dL — AB
Leukocytes, UA: NEGATIVE
NITRITE: NEGATIVE
PH: 5.5 (ref 5.0–8.0)
Protein, ur: 100 mg/dL — AB
SPECIFIC GRAVITY, URINE: 1.031 — AB (ref 1.005–1.030)

## 2015-10-17 LAB — I-STAT BETA HCG BLOOD, ED (MC, WL, AP ONLY): I-stat hCG, quantitative: <5 m[IU]/mL (ref <5)

## 2015-10-17 LAB — RAPID STREP SCREEN (MED CTR MEBANE ONLY): STREPTOCOCCUS, GROUP A SCREEN (DIRECT): NEGATIVE

## 2015-10-17 LAB — I-STAT CG4 LACTIC ACID, ED: Lactic Acid, Venous: 1.38 mmol/L (ref 0.5–2.0)

## 2015-10-17 MED ORDER — IBUPROFEN 400 MG PO TABS
600.0000 mg | ORAL_TABLET | Freq: Once | ORAL | Status: AC
  Administered 2015-10-17: 600 mg via ORAL
  Filled 2015-10-17: qty 1

## 2015-10-17 MED ORDER — ACETAMINOPHEN 325 MG PO TABS
ORAL_TABLET | ORAL | Status: AC
  Filled 2015-10-17: qty 2

## 2015-10-17 MED ORDER — IPRATROPIUM-ALBUTEROL 0.5-2.5 (3) MG/3ML IN SOLN
3.0000 mL | RESPIRATORY_TRACT | Status: DC
Start: 2015-10-17 — End: 2015-10-17
  Administered 2015-10-17: 3 mL via RESPIRATORY_TRACT
  Filled 2015-10-17: qty 3

## 2015-10-17 MED ORDER — DM-GUAIFENESIN ER 30-600 MG PO TB12
1.0000 | ORAL_TABLET | Freq: Two times a day (BID) | ORAL | Status: DC
  Administered 2015-10-17: 1 via ORAL
  Filled 2015-10-17: qty 1

## 2015-10-17 MED ORDER — ACETAMINOPHEN 325 MG PO TABS
650.0000 mg | ORAL_TABLET | Freq: Once | ORAL | Status: AC | PRN
  Administered 2015-10-17: 650 mg via ORAL

## 2015-10-17 MED ORDER — ALBUTEROL SULFATE HFA 108 (90 BASE) MCG/ACT IN AERS
2.0000 | INHALATION_SPRAY | Freq: Once | RESPIRATORY_TRACT | Status: AC
  Administered 2015-10-17: 2 via RESPIRATORY_TRACT
  Filled 2015-10-17: qty 6.7

## 2015-10-17 NOTE — ED Provider Notes (Signed)
CSN: [id number]     Arrival date & time 10/17/15  0029 History  By signing my name below, I, Emmanuella Mensah, attest that this documentation has been prepared under the direction and in the presence of Tomasita CrumbleAdeleke Nolen Lindamood, MD. Electronically Signed: Angelene GiovanniEmmanuella Mensah, ED Scribe. 10/17/2015. 3:18 AM.    Chief Complaint  Patient presents with  . Generalized Body Aches   The history is provided by the patient. No language interpreter was used.   HPI Comments: Mckenzie Perez is a 45 y.o. female who presents to the Emergency Department complaining of gradually worsening persistent productive cough with thick green mucous onset 5 days ago. She reports associated fever, chills, generalized body aches, and abdominal pain. She reports sick contact with her son who had flu-like symptoms last week. No alleviating factors noted. Pt had Tylenol while waiting here at the ED. No n/v/d.    Past Medical History  Diagnosis Date  . Elevated blood pressure reading without diagnosis of hypertension    Past Surgical History  Procedure Laterality Date  . Tubal ligation    . Mouth surgery     Family History  Problem Relation Age of Onset  . Hypertension Mother   . Cancer Father     lung cancer, bone and brain mets  . Cancer Brother 40    died of stomach cancer  . Stroke Neg Hx   . Heart disease Neg Hx   . Hyperlipidemia Neg Hx   . Diabetes Neg Hx    Social History  Substance Use Topics  . Smoking status: Never Smoker   . Smokeless tobacco: None  . Alcohol Use: No   OB History    No data available     Review of Systems  Constitutional: Positive for fever and chills.  Respiratory: Positive for cough.   Gastrointestinal: Positive for abdominal pain. Negative for nausea, vomiting and diarrhea.  Musculoskeletal: Positive for myalgias.  All other systems reviewed and are negative.     Allergies  Review of patient's allergies indicates no known allergies.  Home Medications   Prior to Admission  medications   Medication Sig Start Date End Date Taking? Authorizing Provider  metroNIDAZOLE (FLAGYL) 500 MG tablet Take 1 tablet (500 mg total) by mouth 2 (two) times daily. Patient not taking: Reported on 10/17/2015 03/06/15   Adam PhenixJames G Arnold, MD  tinidazole [facility](TINDAMAX) 250 MG tablet Take 8 tablets (2,000 mg total) by mouth daily with breakfast. Patient not taking: Reported on 10/17/2015 03/05/15   Jethro BastosUgonna A Anyanwu, MD   BP 122/69 mmHg  Pulse 95  Temp(Src) 103 F (39.4 C) (Oral)  Resp 18  SpO2 96%  LMP  (LMP Unknown) Physical Exam  Constitutional: She is oriented to person, place, and time. She appears well-developed and well-nourished. No distress.  Tactile fever  HENT:  Head: Normocephalic and atraumatic.  Nose: Nose normal.  Mouth/Throat: Oropharynx is clear and moist. No oropharyngeal exudate.  Eyes: Conjunctivae and EOM are normal. Pupils are equal, round, and reactive to light. No scleral icterus.  Neck: Normal range of motion. Neck supple. No JVD present. No tracheal deviation present. No thyromegaly present.  Cardiovascular: Normal rate, regular rhythm and normal heart sounds.  Exam reveals no gallop and no friction rub.   No murmur heard. Pulmonary/Chest: Effort normal and breath sounds normal. No respiratory distress. She has no wheezes. She exhibits no tenderness.  Frequent cough on examination  Abdominal: Soft. Bowel sounds are normal. She exhibits no distension and no mass. There is  no tenderness. There is no rebound and no guarding.  Musculoskeletal: Normal range of motion. She exhibits no edema or tenderness.  Lymphadenopathy:    She has no cervical adenopathy.  Neurological: She is alert and oriented to person, place, and time. No cranial nerve deficit. She exhibits normal muscle tone.  Skin: Skin is warm and dry. No rash noted. No erythema. No pallor.  Nursing note and vitals reviewed.   ED Course  Procedures (including critical care time) DIAGNOSTIC  STUDIES: Oxygen Saturation is 96% on RA, normal by my interpretation.    COORDINATION OF CARE: 2:52 AM- Pt advised of plan for treatment and pt agrees. Pt informed of x-ray and lab results. She will receive duo neb, ibuprofen, Mucinex, and tylenol.    Labs Review Labs Reviewed  COMPREHENSIVE METABOLIC PANEL - Abnormal; Notable for the following:    Sodium 132 (*)    Chloride 99 (*)    CO2 21 (*)    Glucose, Bld 130 (*)    Calcium 8.6 (*)    Albumin 3.3 (*)    Total Bilirubin 1.4 (*)    All other components within normal limits  URINALYSIS, ROUTINE W REFLEX MICROSCOPIC (NOT AT [facility]) - Abnormal; Notable for the following:    Color, Urine ORANGE (*)    APPearance CLOUDY (*)    Specific Gravity, Urine 1.031 (*)    Bilirubin Urine SMALL (*)    Ketones, ur >80 (*)    Protein, ur 100 (*)    All other components within normal limits  URINE MICROSCOPIC-ADD ON - Abnormal; Notable for the following:    Squamous Epithelial / LPF 6-30 (*)    Bacteria, UA FEW (*)    All other components within normal limits  RAPID STREP SCREEN (NOT AT [facility])  URINE CULTURE  CBC WITH DIFFERENTIAL/PLATELET  I-STAT CG4 LACTIC ACID, ED  I-STAT BETA HCG BLOOD, ED ([facility], [facility], [facility] ONLY)  I-STAT CG4 LACTIC ACID, ED    Imaging Review Dg Chest 2 View  10/17/2015  CLINICAL DATA:  Fever and productive cough beginning 5 days ago. EXAM: CHEST  2 VIEW COMPARISON:  None. FINDINGS: Cardiomediastinal silhouette is normal. The lungs are clear without pleural effusions or focal consolidations. Trachea projects midline and there is no pneumothorax. Soft tissue planes and included osseous structures are non-suspicious. IMPRESSION: Normal chest. Electronically Signed   By: Awilda Metro M.D.   On: 10/17/2015 01:14   Tomasita Crumble, MD has personally reviewed and evaluated these images and lab results as part of his medical decision-making.  MDM   Final diagnoses:  None     patient presents to the emergency department for  body aches, fever, chills, and cough. Her history is cnistet with the flu. She has sick contacts and her son who just had the flu. Laboratory studies show dehydration, patient was educated to stay well-hydrated.she was given DuoNeb treatment for her cough, Mucinex for congestion,Tylenol or ibuprofen for fever. Advisd to continue Motrin at home as needed.she'll be given albuterol inhaler forher cough. She appears well in no acute distress, vital signs remain within her normal limits and she is safe for discharge.  I personally performed the services described in this documentation, which was scribed in my presence. The recorded information has been reviewed and is accurate.     Tomasita Crumble, MD 10/17/15 [phone]

## 2015-10-17 NOTE — ED Notes (Signed)
Pt here with c/o body aches, chills, nausea, congestion, productive cough of gray sputum and diarrhea; onset last Sunday.

## 2015-10-17 NOTE — ED Notes (Signed)
Patient c/o generalized body aches, fever/chills, and cough x 1 week. States her whole family is sick, starting with her children who got it at school.

## 2015-10-17 NOTE — Discharge Instructions (Signed)
Influenza, Adult Mckenzie Perez, You likely have the flu.  Take tylenol and ibuprofen for fever and body aches.  Be sure to stay well hydrated and drink plenty of fluids.  Your blood work shows dehydration.  Use albuterol inhaler 4 puffs every 6hours as needed for coughing.  See your primary care doctor within 3 days for close follow up. If any symptoms worsen, come back to the ED immediately. Thank you. Influenza (flu) is an infection in the mouth, nose, and throat (respiratory tract) caused by a virus. The flu can make you feel very ill. Influenza spreads easily from person to person (contagious).  HOME CARE   Only take medicines as told by your doctor.  Use a cool mist humidifier to make breathing easier.  Get plenty of rest until your fever goes away. This usually takes 3 to 4 days.  Drink enough fluids to keep your pee (urine) clear or pale yellow.  Cover your mouth and nose when you cough or sneeze.  Wash your hands well to avoid spreading the flu.  Stay home from work or school until your fever has been gone for at least 1 full day.  Get a flu shot every year. GET HELP RIGHT AWAY IF:   You have trouble breathing or feel short of breath.  Your skin or nails turn blue.  You have severe neck pain or stiffness.  You have a severe headache, facial pain, or earache.  Your fever gets worse or keeps coming back.  You feel sick to your stomach (nauseous), throw up (vomit), or have watery poop (diarrhea).  You have chest pain.  You have a deep cough that gets worse, or you cough up more thick spit (mucus). MAKE SURE YOU:   Understand these instructions.  Will watch your condition.  Will get help right away if you are not doing well or get worse.   This information is not intended to replace advice given to you by your health care provider. Make sure you discuss any questions you have with your health care provider.   Document Released: 04/19/2008 Document Revised: 08/01/2014  Document Reviewed: 10/10/2011 Elsevier Interactive Patient Education [employer]2016 [employer] Inc[employer]

## 2015-10-18 LAB — URINE CULTURE

## 2015-10-19 LAB — CULTURE, GROUP A STREP (THRC)

## 2015-10-23 ENCOUNTER — Emergency Department (HOSPITAL_COMMUNITY)
Admission: EM | Admit: 2015-10-23 | Discharge: 2015-10-23 | Disposition: A | Discharge status: Home / Self Care | Payer: Medicaid Other | Attending: Emergency Medicine | Admitting: Emergency Medicine

## 2015-10-23 ENCOUNTER — Encounter (HOSPITAL_COMMUNITY): Payer: Self-pay | Admitting: *Deleted

## 2015-10-23 DIAGNOSIS — H9202 Otalgia, left ear: Secondary | ICD-10-CM | POA: Diagnosis present

## 2015-10-23 DIAGNOSIS — H66002 Acute suppurative otitis media without spontaneous rupture of ear drum, left ear: Secondary | ICD-10-CM | POA: Diagnosis not present

## 2015-10-23 DIAGNOSIS — H6123 Impacted cerumen, bilateral: Secondary | ICD-10-CM | POA: Insufficient documentation

## 2015-10-23 DIAGNOSIS — Z79899 Other long term (current) drug therapy: Secondary | ICD-10-CM | POA: Diagnosis not present

## 2015-10-23 MED ORDER — AMOXICILLIN-POT CLAVULANATE 875-125 MG PO TABS: 1.0000 | ORAL_TABLET | Freq: Two times a day (BID) | ORAL | Status: AC

## 2015-10-23 NOTE — ED Notes (Signed)
Pt here with left ear pain.  Pt states that the left ear has felt "clogged and painful" since 3/25.  Pt also reports that she cant hear very well from that ear.  No fever or chills anymore.  Pt recently had the flu

## 2015-10-23 NOTE — ED Provider Notes (Signed)
CSN: [id number]     Arrival date & time 10/23/15  0041 History  By signing my name below, I, Bethel Born, attest that this documentation has been prepared under the direction and in the presence of Gilda Crease, MD. Electronically Signed: Bethel Born, ED Scribe. 10/23/2015. 2:46 AM   Chief Complaint  Patient presents with  . Otalgia   The history is provided by the patient. No language interpreter was used.   Mckenzie Perez is a 45 y.o. female who presents to the Emergency Department complaining of constant, 6/10 in severity, aching, left ear pain with onset 6 days ago. Associated symptoms include muffled hearing in the left ear and dizziness with standing. Pt states that she was treated for the flu 5 days ago and her cough has lingered but improved with inhaler use.   Past Medical History  Diagnosis Date  . Elevated blood pressure reading without diagnosis of hypertension    Past Surgical History  Procedure Laterality Date  . Tubal ligation    . Mouth surgery     Family History  Problem Relation Age of Onset  . Hypertension Mother   . Cancer Father     lung cancer, bone and brain mets  . Cancer Brother 40    died of stomach cancer  . Stroke Neg Hx   . Heart disease Neg Hx   . Hyperlipidemia Neg Hx   . Diabetes Neg Hx    Social History  Substance Use Topics  . Smoking status: Never Smoker   . Smokeless tobacco: None  . Alcohol Use: No   OB History    No data available     Review of Systems  HENT: Positive for ear pain and hearing loss.   Respiratory: Positive for cough.   Neurological: Positive for dizziness.  All other systems reviewed and are negative.  Allergies  Review of patient's allergies indicates no known allergies.  Home Medications   Prior to Admission medications   Medication Sig Start Date End Date Taking? Authorizing Provider  albuterol (PROVENTIL HFA;VENTOLIN HFA) 108 (90 Base) MCG/ACT inhaler Inhale 2 puffs into the lungs every 4  (four) hours as needed for wheezing or shortness of breath.   Yes Historical Provider, MD  ibuprofen (ADVIL,MOTRIN) 200 MG tablet Take 600 mg by mouth every 4 (four) hours as needed for fever or moderate pain.   Yes Historical Provider, MD  Multiple Vitamin (MULTIVITAMIN WITH MINERALS) TABS tablet Take 1 tablet by mouth daily.   Yes Historical Provider, MD  pseudoephedrine (SUDAFED) 30 MG tablet Take 30 mg by mouth every 4 (four) hours as needed for congestion.   Yes Historical Provider, MD  amoxicillin-clavulanate (AUGMENTIN) 875-125 MG tablet Take 1 tablet by mouth 2 (two) times daily. 10/23/15   Gilda Crease, MD   BP 118/92 mmHg  Pulse 103  Temp(Src) 97.8 F (36.6 C) (Oral)  Resp 16  SpO2 97%  LMP  (LMP Unknown) Physical Exam  Constitutional: She is oriented to person, place, and time. She appears well-developed and well-nourished. No distress.  HENT:  Head: Normocephalic and atraumatic.  Right Ear: Hearing normal.  Left Ear: Hearing normal.  Nose: Nose normal.  Mouth/Throat: Oropharynx is clear and moist and mucous membranes are normal.  Bilateral cerumen impaction   Eyes: Conjunctivae and EOM are normal. Pupils are equal, round, and reactive to light.  Neck: Normal range of motion. Neck supple.  Cardiovascular: Regular rhythm, S1 normal and S2 normal.  Exam reveals no gallop and  no friction rub.   No murmur heard. Pulmonary/Chest: Effort normal and breath sounds normal. No respiratory distress. She exhibits no tenderness.  Abdominal: Soft. Normal appearance and bowel sounds are normal. There is no hepatosplenomegaly. There is no tenderness. There is no rebound, no guarding, no tenderness at McBurney's point and negative Murphy's sign. No hernia.  Musculoskeletal: Normal range of motion.  Neurological: She is alert and oriented to person, place, and time. She has normal strength. No cranial nerve deficit or sensory deficit. Coordination normal. GCS eye subscore is 4. GCS  verbal subscore is 5. GCS motor subscore is 6.  Skin: Skin is warm, dry and intact. No rash noted. No cyanosis.  Psychiatric: She has a normal mood and affect. Her speech is normal and behavior is normal. Thought content normal.  Nursing note and vitals reviewed.   ED Course  Procedures (including critical care time) DIAGNOSTIC STUDIES: Oxygen Saturation is 97% on RA,  normal by my interpretation.    COORDINATION OF CARE: 2:44 AM Discussed treatment plan which includes cleaning the ears with pt at bedside and pt agreed to plan.  Labs Review Labs Reviewed - No data to display  Imaging Review No results found.    EKG Interpretation None      MDM   Final diagnoses:  Cerumen impaction, bilateral  Acute suppurative otitis media of left ear without spontaneous rupture of tympanic membrane, recurrence not specified   Patient presents to the ER for evaluation of left ear discomfort. Patient reports that she is having pain and feeling like her left ear is clogged, has decreased hearing. This is been ongoing for several days. She was recently seen in the ER and treated for flu symptoms. Examination revealed bilateral cerumen impactions. These were cleared by nursing staff. Repeat examination revealed purulent otitis media on the left. Patient initiated on Augmentin.  I personally performed the services described in this documentation, which was scribed in my presence. The recorded information has been reviewed and is accurate.     Gilda Creasehristopher J Miroslav Gin, MD 10/23/15 [phone]

## 2015-10-23 NOTE — Discharge Instructions (Signed)

## 2017-04-16 IMAGING — DX DG CHEST 2V
2 series · 2 of 2 positions shown · non-contrast
Comparison: None.

CLINICAL DATA: Fever and productive cough beginning 5 days ago.

EXAM:
CHEST  2 VIEW

[chest pa]
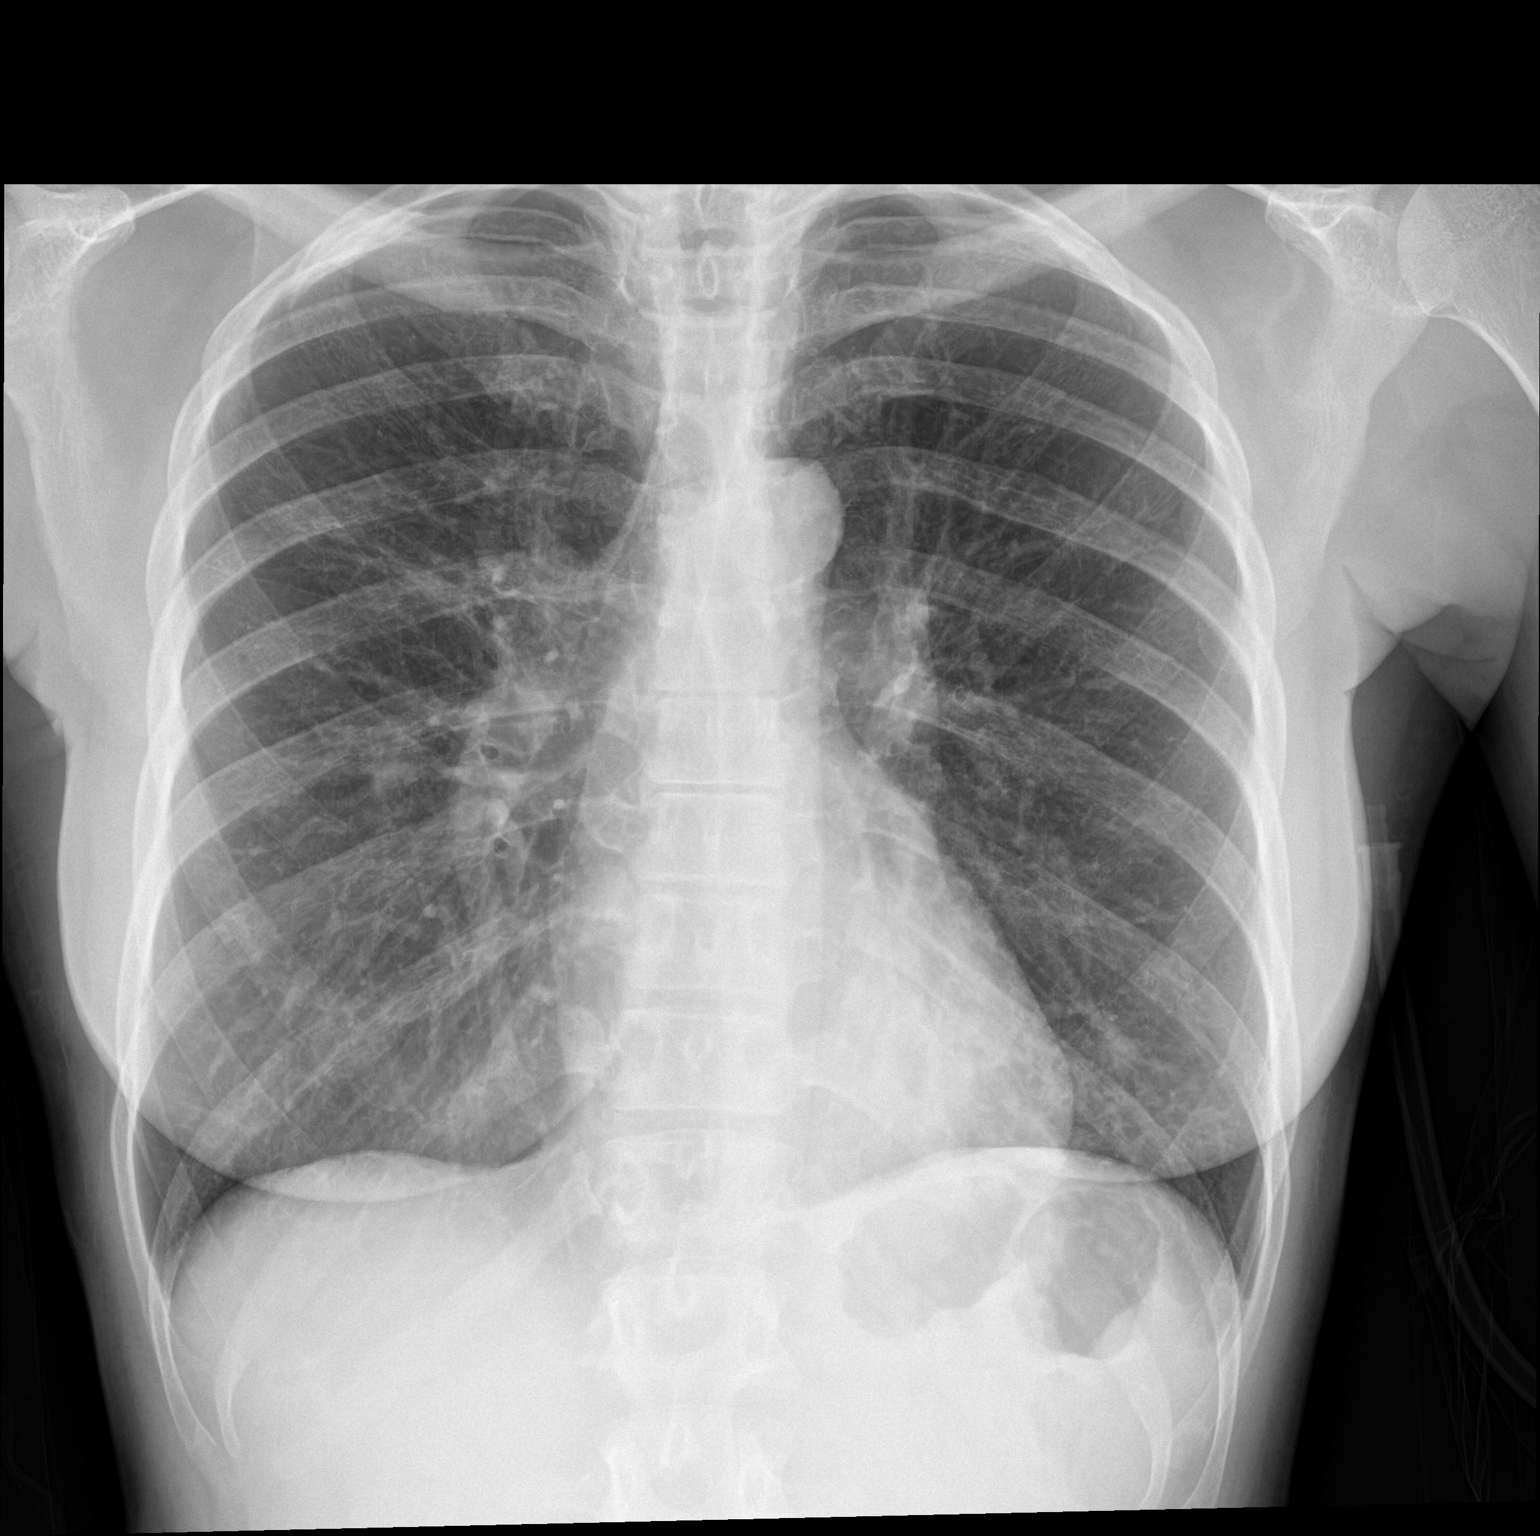

[chest lat]
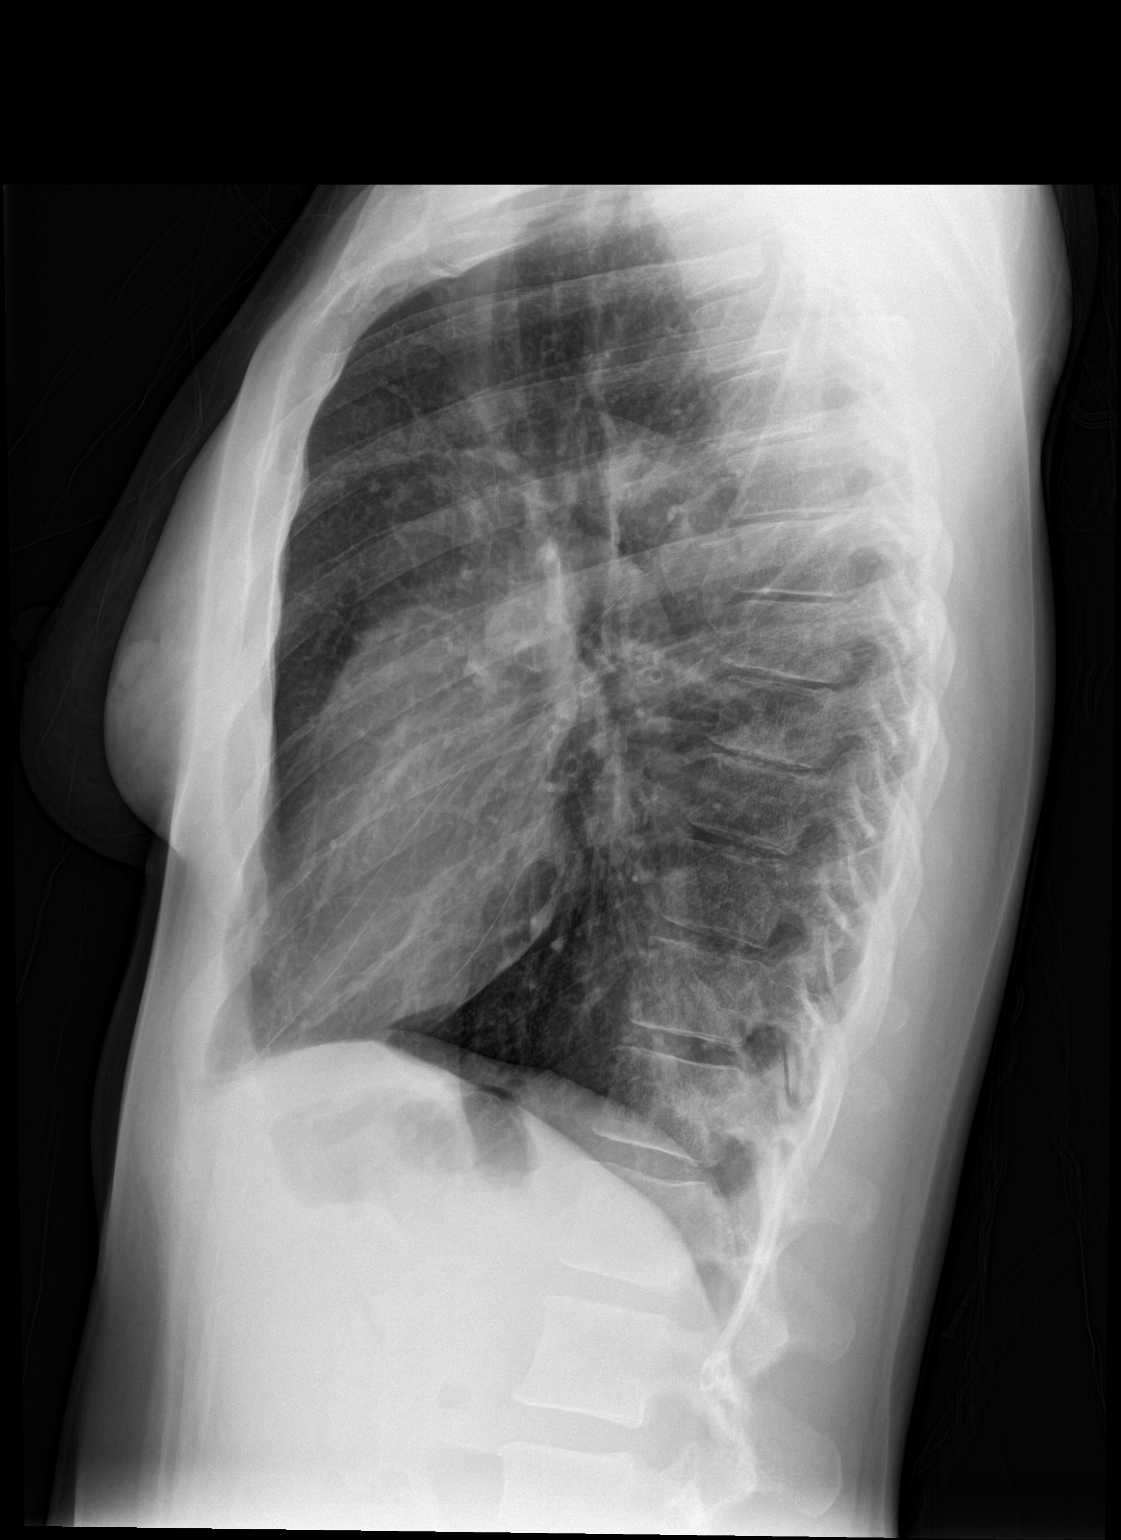

[2 of 2 positions shown; findings below may reference images not displayed]

FINDINGS: Cardiomediastinal silhouette is normal. The lungs are clear without
pleural effusions or focal consolidations. Trachea projects midline
and there is no pneumothorax. Soft tissue planes and included
osseous structures are non-suspicious.
IMPRESSION: Normal chest.

## 2019-09-04 ENCOUNTER — Other Ambulatory Visit: Payer: Self-pay | Admitting: Obstetrics and Gynecology

## 2019-09-04 DIAGNOSIS — N6310 Unspecified lump in the right breast, unspecified quadrant: Secondary | ICD-10-CM

## 2019-09-20 ENCOUNTER — Other Ambulatory Visit: Payer: Self-pay | Admitting: Obstetrics and Gynecology

## 2019-09-20 ENCOUNTER — Ambulatory Visit
Admission: RE | Admit: 2019-09-20 | Discharge: 2019-09-20 | Disposition: A | Discharge status: Home / Self Care | Payer: BC Managed Care – PPO | Source: Ambulatory Visit | Attending: Obstetrics and Gynecology | Admitting: Obstetrics and Gynecology

## 2019-09-20 ENCOUNTER — Other Ambulatory Visit: Payer: Self-pay

## 2019-09-20 DIAGNOSIS — N6310 Unspecified lump in the right breast, unspecified quadrant: Secondary | ICD-10-CM

## 2019-09-20 DIAGNOSIS — R921 Mammographic calcification found on diagnostic imaging of breast: Secondary | ICD-10-CM

## 2019-10-03 ENCOUNTER — Ambulatory Visit
Admission: RE | Admit: 2019-10-03 | Discharge: 2019-10-03 | Disposition: A | Discharge status: Home / Self Care | Payer: BC Managed Care – PPO | Source: Ambulatory Visit | Attending: Obstetrics and Gynecology | Admitting: Obstetrics and Gynecology

## 2019-10-03 ENCOUNTER — Other Ambulatory Visit: Payer: Self-pay

## 2019-10-03 DIAGNOSIS — R921 Mammographic calcification found on diagnostic imaging of breast: Secondary | ICD-10-CM

## 2019-10-03 HISTORY — PX: BREAST BIOPSY: SHX20

## 2020-06-10 ENCOUNTER — Other Ambulatory Visit: Payer: Self-pay | Admitting: Obstetrics and Gynecology

## 2020-06-10 DIAGNOSIS — R921 Mammographic calcification found on diagnostic imaging of breast: Secondary | ICD-10-CM

## 2020-07-10 ENCOUNTER — Other Ambulatory Visit: Payer: Self-pay

## 2020-07-10 ENCOUNTER — Ambulatory Visit
Admission: RE | Admit: 2020-07-10 | Discharge: 2020-07-10 | Disposition: A | Discharge status: Home / Self Care | Payer: 59 | Source: Ambulatory Visit | Attending: Obstetrics and Gynecology | Admitting: Obstetrics and Gynecology

## 2020-07-10 DIAGNOSIS — R921 Mammographic calcification found on diagnostic imaging of breast: Secondary | ICD-10-CM

## 2021-04-02 IMAGING — MG MM BREAST BX W LOC DEV 1ST LESION IMAGE BX SPEC STEREO GUIDE*L*
6 series · 6 of 14 positions shown · non-contrast
Comparison: Previous exams.
COMPARISON: Previous exams.

Addendum:
CLINICAL DATA: 48-year-old female for tissue sampling of 0.4 cm
group of calcifications in the LOWER OUTER LEFT breast.

EXAM:
LEFT BREAST STEREOTACTIC CORE NEEDLE BIOPSY

[L (1 of 3)]
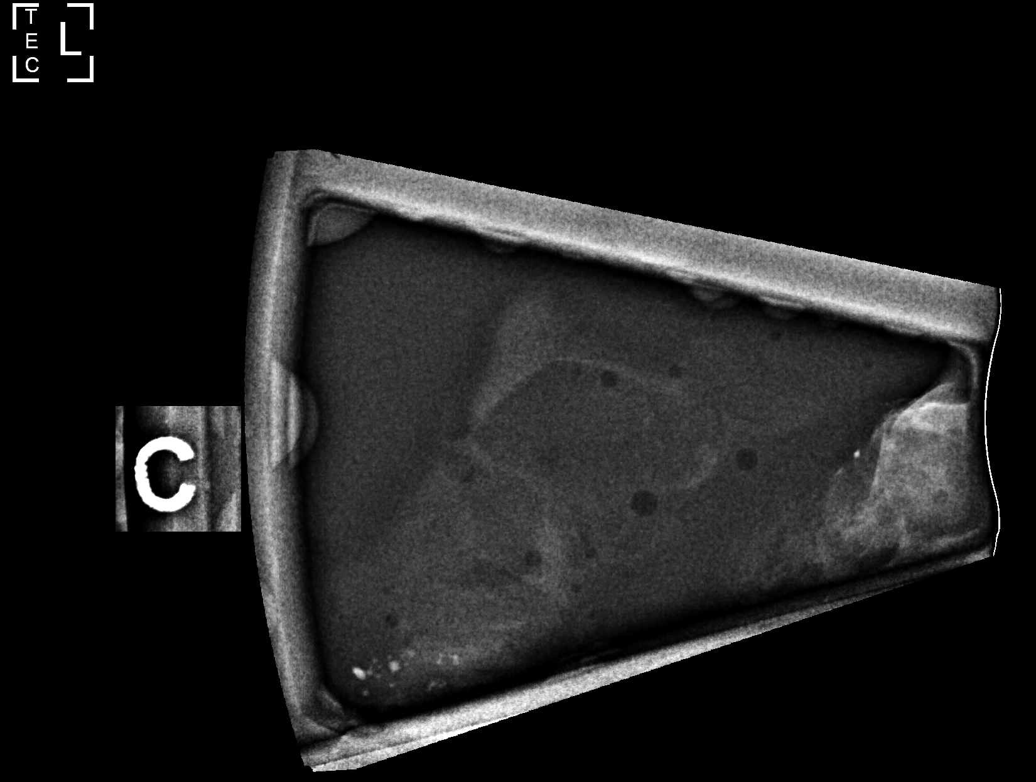

[L (2 of 3)]
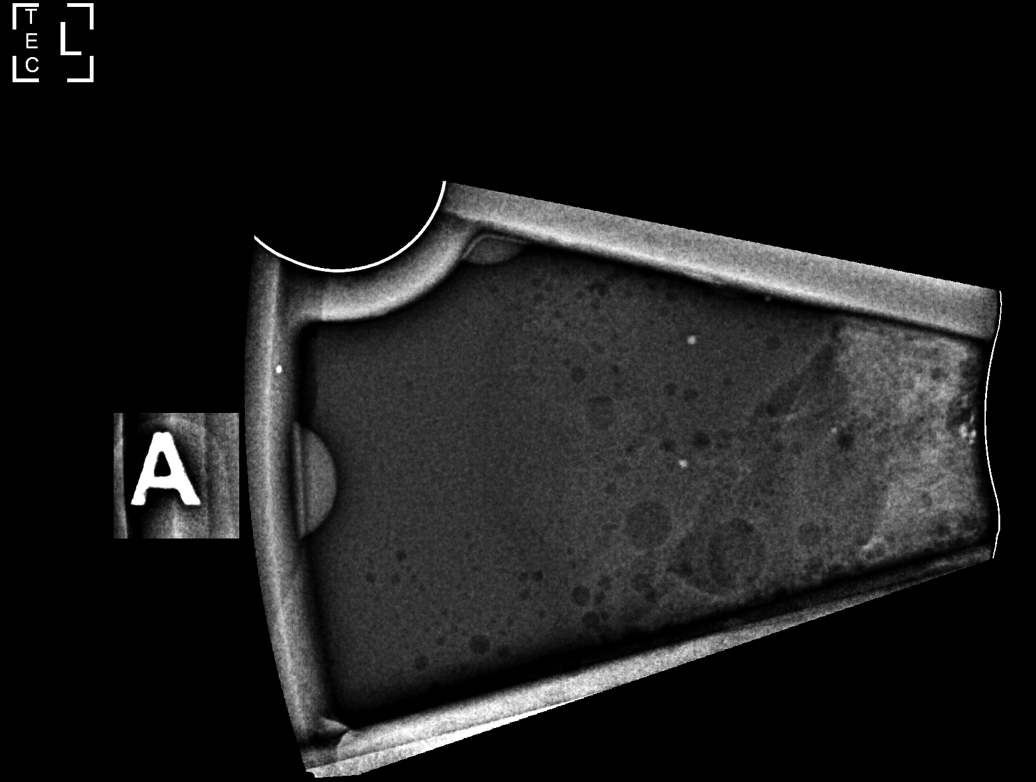

[L (3 of 3)]
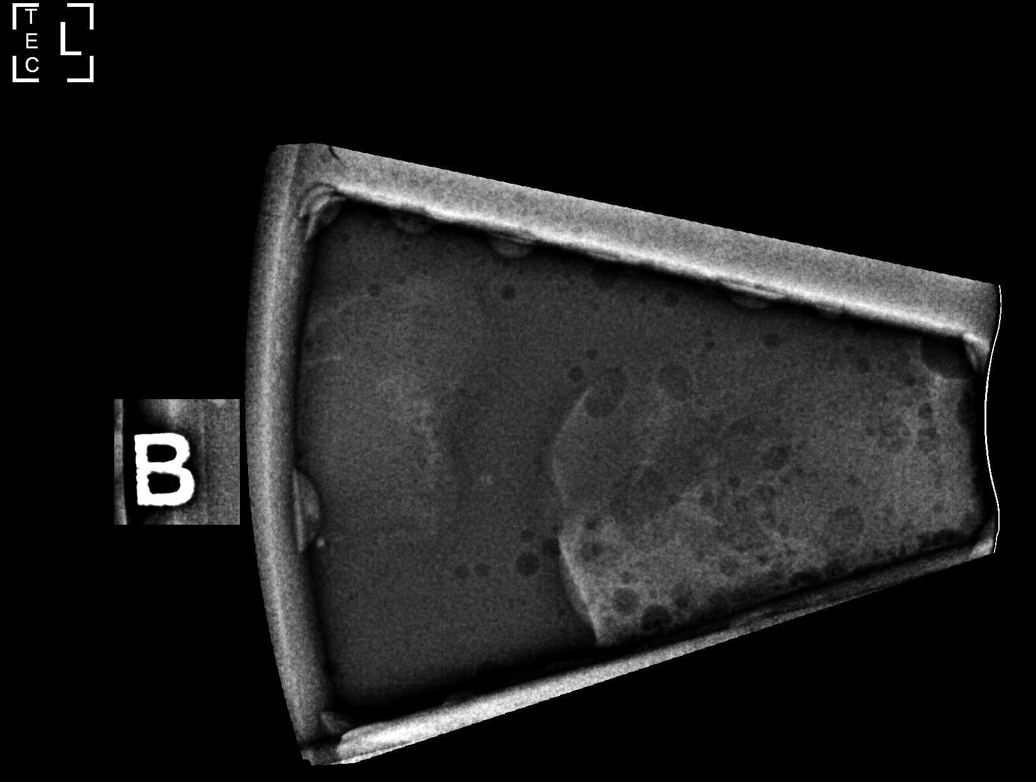

[L LM]
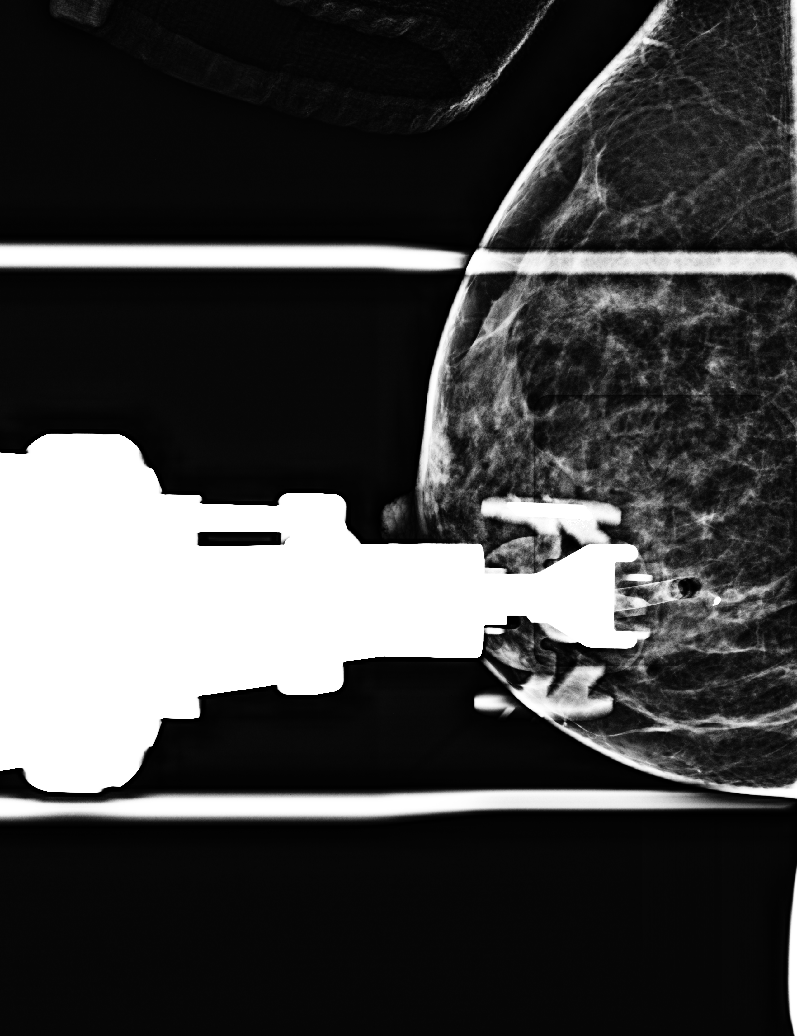

[L LM tomo (1 of 2) · tomo slice 30/59.0]
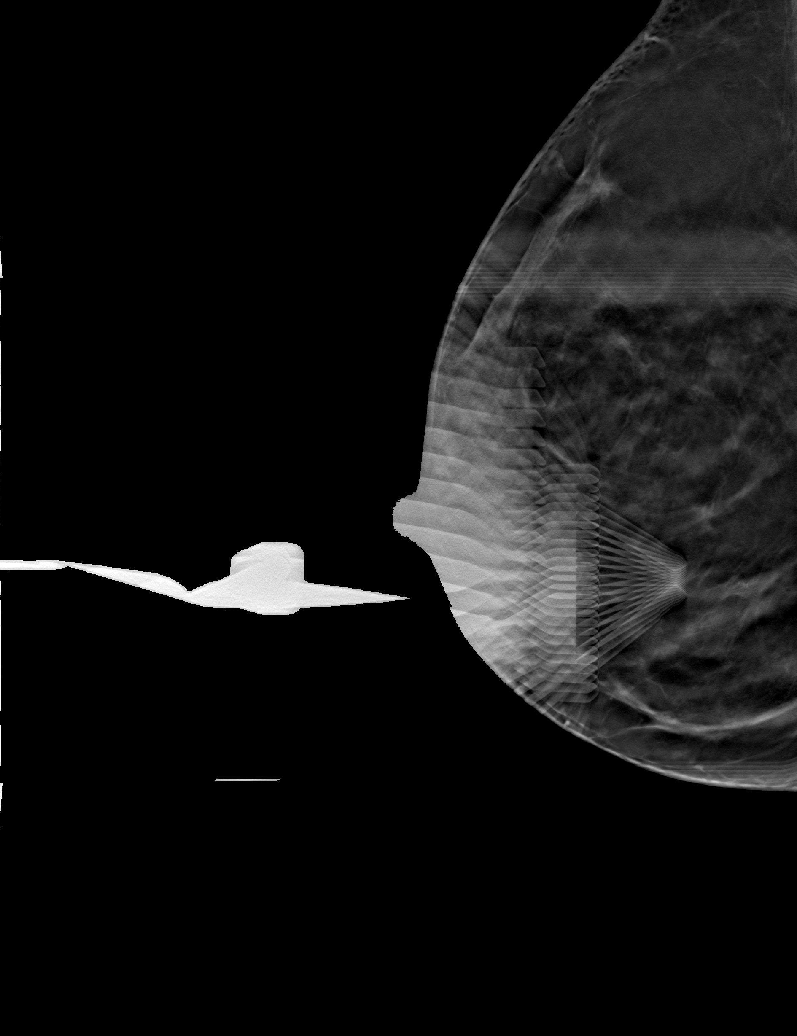

[L LM tomo (2 of 2) · tomo slice 30/59.0]
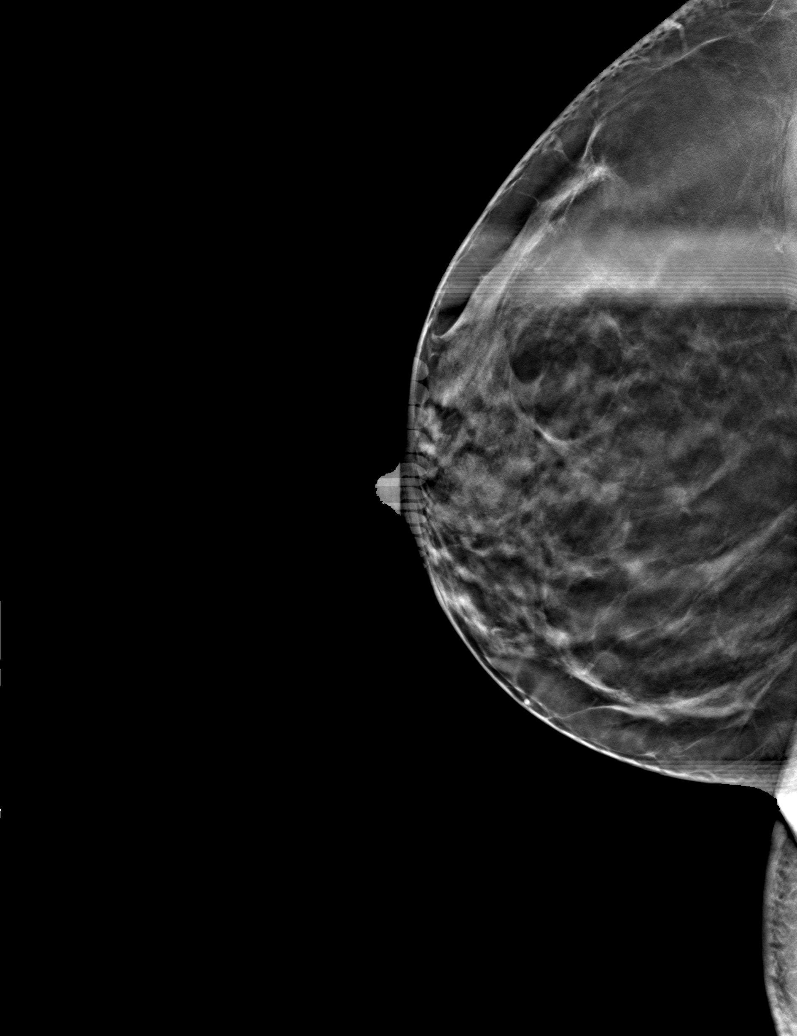

[6 of 14 positions shown; findings below may reference images not displayed]



Using sterile technique and 1% Lidocaine as local anesthetic, under
stereotactic guidance, a 9 gauge vacuum assisted device was used to
perform core needle biopsy of 0.4 cm group of calcifications in the
lower outer quadrant of the left breast using a LATERAL approach.
Specimen radiograph was performed showing calcifications. Specimens
with calcifications are identified for pathology.

Lesion quadrant: LOWER OUTER LEFT breast

At the conclusion of the procedure, a COIL tissue marker clip was
deployed into the biopsy cavity. Follow-up 2-view mammogram was
performed and dictated separately.
IMPRESSION: Stereotactic-guided biopsy of LOWER OUTER LEFT breast
calcifications. No apparent complications.

ADDENDUM:
Pathology revealed FIBROCYSTIC CHANGE AND CALCIFICATIONS of the LEFT
breast, lower outer. This was found to be concordant by Dr. Barcelonista Bedoya Bedoya.

Pathology results were discussed with the patient by telephone. The
patient reported doing well after the biopsy with tenderness at the
site. Post biopsy instructions and care were reviewed and questions
were answered. The patient was encouraged to call The [REDACTED]

The patient was instructed to return for LEFT diagnostic mammography
in 6 months and informed a reminder notice would be sent regarding
this appointment.

Pathology results reported by Hoi To Kamilah, RN on 10/04/2019.



Using sterile technique and 1% Lidocaine as local anesthetic, under
stereotactic guidance, a 9 gauge vacuum assisted device was used to
perform core needle biopsy of 0.4 cm group of calcifications in the
lower outer quadrant of the left breast using a LATERAL approach.
Specimen radiograph was performed showing calcifications. Specimens
with calcifications are identified for pathology.

Lesion quadrant: LOWER OUTER LEFT breast

At the conclusion of the procedure, a COIL tissue marker clip was
deployed into the biopsy cavity. Follow-up 2-view mammogram was
performed and dictated separately.
IMPRESSION: Stereotactic-guided biopsy of LOWER OUTER LEFT breast
calcifications. No apparent complications.

## 2023-02-22 ENCOUNTER — Other Ambulatory Visit: Payer: Self-pay | Admitting: Physician Assistant

## 2023-02-22 DIAGNOSIS — Z1231 Encounter for screening mammogram for malignant neoplasm of breast: Secondary | ICD-10-CM

## 2023-03-01 LAB — COLOGUARD: COLOGUARD: NEGATIVE

## 2023-03-03 ENCOUNTER — Ambulatory Visit
Admission: RE | Admit: 2023-03-03 | Discharge: 2023-03-03 | Disposition: A | Discharge status: Home / Self Care | Payer: 59 | Source: Ambulatory Visit | Attending: Physician Assistant | Admitting: Physician Assistant

## 2023-03-03 DIAGNOSIS — Z1231 Encounter for screening mammogram for malignant neoplasm of breast: Secondary | ICD-10-CM

## 2023-03-06 ENCOUNTER — Other Ambulatory Visit: Payer: Self-pay | Admitting: Physician Assistant

## 2023-03-06 DIAGNOSIS — R921 Mammographic calcification found on diagnostic imaging of breast: Secondary | ICD-10-CM

## 2023-03-21 ENCOUNTER — Other Ambulatory Visit: Payer: Self-pay | Admitting: Physician Assistant

## 2023-03-21 ENCOUNTER — Ambulatory Visit: Admission: RE | Admit: 2023-03-21 | Payer: 59 | Source: Ambulatory Visit

## 2023-03-21 DIAGNOSIS — R921 Mammographic calcification found on diagnostic imaging of breast: Secondary | ICD-10-CM

## 2023-04-14 ENCOUNTER — Ambulatory Visit: Payer: 59 | Admitting: Nurse Practitioner

## 2023-04-27 ENCOUNTER — Encounter: Payer: Self-pay | Admitting: Obstetrics and Gynecology

## 2023-04-27 ENCOUNTER — Ambulatory Visit: Payer: 59 | Admitting: Obstetrics and Gynecology

## 2023-04-27 VITALS — BP 141/86 | HR 76 | Ht 67.0 in | Wt 155.0 lb

## 2023-04-27 DIAGNOSIS — R87610 Atypical squamous cells of undetermined significance on cytologic smear of cervix (ASC-US): Secondary | ICD-10-CM

## 2023-04-27 NOTE — Progress Notes (Signed)
  CC: abnl pap Subjective:    Patient ID: Mckenzie Perez, female    DOB: August 20, 1970, 52 y.o.   MRN: [id number]  HPI  Patient seen as a referral for abnormal pap.  Pap smear reviewed in CareEverywhere and pt had ASCUS with negative high risk HPV.  Current ASCCP guidelines recommend with neagative HPV testing associated with this diagnosis, the patient should repeat pap in 3 years.  If there was no HPV testing, repap in 1 year would be acceptable.  Review of Systems     Objective:   Physical Exam Vitals:   04/27/23 1419  BP: (!) 141/86  Pulse: 76         Assessment & Plan:   1. ASCUS of cervix with negative high risk HPV As above, no intervention at this time. Routine pap with cotesting in 3 years.  I spent 15 minutes dedicated to the care of this patient including previsit review of records, face to face time with the patient discussing current guidelines and post visit testing.     Warden Fillers, MD Faculty Attending, [facility]

## 2024-05-14 ENCOUNTER — Other Ambulatory Visit: Payer: Self-pay | Admitting: Physician Assistant

## 2024-05-14 DIAGNOSIS — Z1231 Encounter for screening mammogram for malignant neoplasm of breast: Secondary | ICD-10-CM

## 2024-05-31 ENCOUNTER — Ambulatory Visit
Admission: RE | Admit: 2024-05-31 | Discharge: 2024-05-31 | Disposition: A | Discharge status: Home / Self Care | Source: Ambulatory Visit | Attending: Physician Assistant | Admitting: Physician Assistant

## 2024-05-31 DIAGNOSIS — Z1231 Encounter for screening mammogram for malignant neoplasm of breast: Secondary | ICD-10-CM
